# Patient Record
Sex: Female | Born: 1995 | Race: White | Hispanic: No | Marital: Single | Smoking: Never smoker
Health system: Southern US, Community
[De-identification: ages and names within clinical notes are randomized; demographics above are authoritative.]

## PROBLEM LIST (undated history)

## (undated) DIAGNOSIS — S060XAA Concussion with loss of consciousness status unknown, initial encounter: Secondary | ICD-10-CM

## (undated) DIAGNOSIS — S060X9A Concussion with loss of consciousness of unspecified duration, initial encounter: Secondary | ICD-10-CM

## (undated) HISTORY — DX: Concussion with loss of consciousness of unspecified duration, initial encounter: S06.0X9A

## (undated) HISTORY — DX: Concussion with loss of consciousness status unknown, initial encounter: S06.0XAA

---
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
## 2015-01-22 ENCOUNTER — Encounter: Payer: Self-pay | Admitting: Physician Assistant

## 2015-01-22 ENCOUNTER — Ambulatory Visit (INDEPENDENT_AMBULATORY_CARE_PROVIDER_SITE_OTHER): Payer: Managed Care, Other (non HMO) | Admitting: Physician Assistant

## 2015-01-22 VITALS — BP 129/80 | HR 78 | Resp 18 | Ht 62.0 in | Wt 162.0 lb

## 2015-01-22 DIAGNOSIS — G43009 Migraine without aura, not intractable, without status migrainosus: Secondary | ICD-10-CM

## 2015-01-22 MED ORDER — IBUPROFEN 600 MG PO TABS: 600.0000 mg | ORAL_TABLET | Freq: Four times a day (QID) | ORAL | Status: AC | PRN

## 2015-01-22 MED ORDER — TOPIRAMATE 25 MG PO TABS: 25.0000 mg | ORAL_TABLET | Freq: Two times a day (BID) | ORAL | Status: DC

## 2015-01-22 NOTE — Progress Notes (Signed)
Patient ID: Jocelyn Reese, female   DOB: 08-31-1995, 19 y.o.   MRN: [id number] History:  Jocelyn Reese is a 19 y.o. No obstetric history on file. who presents to clinic today for migraine follow up.  She started taking Topamax one month ago and has successfully titrated to 75mg  She notes significant improvement already.  The HA's are much less severe.  She noted a week of decreased appetite but this has been better. She has also had a couple hours here and there with tingling of fingers.   She used Imitrex 2 different days last week for Moderate HA's and when she awoke the next morning HA free.  No notable side effects.     HIT6:61 Number of days in the last 4 weeks with:  Severe headache: 0 Moderate headache: 2 Mild headache: 16  No headache: 10   Past Medical History  Diagnosis Date  . 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    Social History   Social History  . Marital Status: Single    Spouse Name: N/A  . Number of Children: N/A  . Years of Education: N/A   Occupational History  . Not on file.   Social History Main Topics  . Smoking status: Never Smoker   . Smokeless tobacco: Never Used  . Alcohol Use: No  . Drug Use: No  . Sexual Activity: Not Currently   Other Topics Concern  . Not on file   Social History Narrative    Family History  Problem Relation Age of Onset  . Crohn's disease Mother   . Colitis Mother   . Cancer Maternal Aunt 20    breast  . Cancer Maternal Aunt     cervical    No Known Allergies  Current Outpatient Prescriptions on File Prior to Visit  Medication Sig Dispense Refill  . ibuprofen (ADVIL,MOTRIN) 600 MG tablet Take 1 tablet (600 mg total) by mouth every 6 (six) hours as needed. 60 tablet 0  . norgestrel-ethinyl estradiol (LO/OVRAL,CRYSELLE) 0.3-30 MG-MCG tablet Take 1 tablet by mouth daily. 1 Package 11  . ondansetron (ZOFRAN ODT) 4 MG disintegrating tablet Take 1 tablet (4 mg total) by  mouth every 8 (eight) hours as needed for nausea or vomiting. 20 tablet 2  . PATADAY 0.2 % SOLN   4  . SUMAtriptan (IMITREX) 100 MG tablet Take 1 tablet (100 mg total) by mouth once as needed for migraine. May repeat in 2 hours if headache persists or recurs. 9 tablet 11  . topiramate (TOPAMAX) 25 MG tablet Take 1 tablet (25 mg total) by mouth 2 (two) times daily. 90 tablet 0  . nortriptyline (PAMELOR) 10 MG capsule   3   No current facility-administered medications on file prior to visit.     Review of Systems:  All pertinent positive/negative included in HPI, all other review of systems are negative  Objective:  Physical Exam BP 129/80 mmHg  Pulse 78  Resp 18  Ht 5\' 2"  (1.575 m)  Wt 162 lb (73.483 kg)  BMI 29.62 kg/m2  LMP 12/08/2014 CONSTITUTIONAL: Well-developed, well-nourished female in no acute distress.  EYES: EOM intact ENT: Normocephalic CARDIOVASCULAR: Regular rate and rhythm with no adventitious sounds.  RESPIRATORY: Normal rate. Clear to auscultation bilaterally.  ENDOCRINE: Normal thyroid.  MUSCULOSKELETAL: Normal ROM, strength equal bilaterallySKIN: Warm, dry without erythema  NEUROLOGICAL: Alert, oriented, CN II-XII grossly intact, Appropriate balance, No dysmetria, Sensation equal bilaterally, Romberg negative.   PSYCH:  Normal behavior, mood   Assessment & Plan:  Assessment: 1. Migraine without aura and without status migrainosus, not intractable      Plan: Continue Topamax at  daily Continue Imitrex for acute HA with  Ibuprofen.   Encouraged to take sooner and not wait for worsening.   May take vit B to help with tingling associated with Topamax. May use Nortriptyline prn sleep occas but preference given for benadryl.  Will not rx nortriptyline for this.   Pt starting college later this week at [employer].  Will return on winter break but may come back fall break prn.    Bertram Denver, PA-C 01/22/2015 9:15 AM

## 2015-01-22 NOTE — Patient Instructions (Signed)

## 2015-03-13 ENCOUNTER — Telehealth: Payer: Self-pay | Admitting: *Deleted

## 2015-03-13 DIAGNOSIS — G43809 Other migraine, not intractable, without status migrainosus: Secondary | ICD-10-CM

## 2015-03-13 MED ORDER — TOPIRAMATE 25 MG PO TABS
25.0000 mg | ORAL_TABLET | Freq: Three times a day (TID) | ORAL | Status: DC
Start: 2015-03-13 — End: 2015-03-16

## 2015-03-13 NOTE — Telephone Encounter (Signed)
I seen where Clydie Braun sent in refills on this medication at the time of visit for patient to take medication BID.  I sent in a new prescription with the new directions TID.

## 2015-03-13 NOTE — Telephone Encounter (Signed)
-----   Message from Kathee Delton, RN sent at 03/13/2015 10:36 AM EDT ----- Patient's pharmacy sent [place] a fax of patient requesting refill on her topiramate   Thanks, Lyla Son, [employer]

## 2015-03-16 ENCOUNTER — Telehealth: Payer: Self-pay | Admitting: Physician Assistant

## 2015-03-16 DIAGNOSIS — G43809 Other migraine, not intractable, without status migrainosus: Secondary | ICD-10-CM

## 2015-03-16 MED ORDER — TOPIRAMATE 25 MG PO TABS: 75.0000 mg | ORAL_TABLET | Freq: Every day | ORAL | Status: DC

## 2015-03-16 NOTE — Telephone Encounter (Signed)
Pt requesting Topamax refill for 90 day supply [facility] [address] in [place].   This is done.   [id number]  [facility]

## 2015-03-18 ENCOUNTER — Telehealth: Payer: Self-pay | Admitting: *Deleted

## 2015-03-18 DIAGNOSIS — G43809 Other migraine, not intractable, without status migrainosus: Secondary | ICD-10-CM

## 2015-03-18 MED ORDER — TOPIRAMATE 25 MG PO TABS: 25.0000 mg | ORAL_TABLET | Freq: Three times a day (TID) | ORAL | Status: DC

## 2015-03-18 NOTE — Telephone Encounter (Signed)
-----   Message from Pennie Banter sent at 03/15/2015  9:31 AM EDT ----- Needs her Topamax changed to a three month supply and needs to [facility] [place].

## 2015-03-18 NOTE — Telephone Encounter (Signed)
I have sent in a 90 day supply to patients pharmacy.

## 2015-04-17 ENCOUNTER — Other Ambulatory Visit: Payer: Self-pay | Admitting: Physician Assistant

## 2015-05-28 ENCOUNTER — Ambulatory Visit (INDEPENDENT_AMBULATORY_CARE_PROVIDER_SITE_OTHER): Payer: Managed Care, Other (non HMO) | Admitting: Physician Assistant

## 2015-05-28 ENCOUNTER — Encounter: Payer: Self-pay | Admitting: Physician Assistant

## 2015-05-28 VITALS — BP 125/75 | HR 71 | Resp 18 | Ht 62.0 in | Wt 152.0 lb

## 2015-05-28 DIAGNOSIS — G43809 Other migraine, not intractable, without status migrainosus: Secondary | ICD-10-CM | POA: Diagnosis not present

## 2015-05-28 DIAGNOSIS — G43009 Migraine without aura, not intractable, without status migrainosus: Secondary | ICD-10-CM

## 2015-05-28 DIAGNOSIS — R198 Other specified symptoms and signs involving the digestive system and abdomen: Secondary | ICD-10-CM | POA: Diagnosis not present

## 2015-05-28 DIAGNOSIS — G4700 Insomnia, unspecified: Secondary | ICD-10-CM | POA: Diagnosis not present

## 2015-05-28 MED ORDER — RIZATRIPTAN BENZOATE 10 MG PO TABS: 10.0000 mg | ORAL_TABLET | ORAL | Status: AC | PRN

## 2015-05-28 MED ORDER — BACLOFEN 10 MG PO TABS: 10.0000 mg | ORAL_TABLET | Freq: Three times a day (TID) | ORAL | Status: DC

## 2015-05-28 MED ORDER — PROMETHAZINE HCL 25 MG PO TABS: 25.0000 mg | ORAL_TABLET | Freq: Four times a day (QID) | ORAL | Status: DC | PRN

## 2015-05-28 MED ORDER — TOPIRAMATE 25 MG PO TABS: 75.0000 mg | ORAL_TABLET | Freq: Every day | ORAL | Status: DC

## 2015-05-28 NOTE — Progress Notes (Signed)
Patient ID: Jocelyn Reese, female   DOB: 09/16/1995, 19 y.o.   MRN: [id number] History:  Jocelyn NashMarissa C Sagraves is a 19 y.o. who presents to clinic today for follow up of headaches.  She had increased frequency when first starting school.  She also had a period of time that she had to decrease the Topamax due to tingling and numbness.  After her performance, she increased again and that was fine.   Imitrex helps the acute migraines but it is difficult to go to sleep with it.  Her HA's tend to be at night and she always has a difficult time sleeping.  She has noticed waking with clenched jaw.  This may be contributing to headache.  It occurs more with stress such as college finals week.    HIT6: 59 Number of days in the last 4 weeks with:  Severe headache: 2 Moderate headache: 5 Mild headache: 5  No headache: 16   Past Medical History  Diagnosis Date  . 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    Social History   Social History  . Marital Status: Single    Spouse Name: N/A  . Number of Children: N/A  . Years of Education: N/A   Occupational History  . Not on file.   Social History Main Topics  . Smoking status: Never Smoker   . Smokeless tobacco: Never Used  . Alcohol Use: No  . Drug Use: No  . Sexual Activity: Not Currently    Birth Control/ Protection: Pill   Other Topics Concern  . Not on file   Social History Narrative    Family History  Problem Relation Age of Onset  . Crohn's disease Mother   . Colitis Mother   . Cancer Maternal Aunt 3270    breast  . Cancer Maternal Aunt     cervical    No Known Allergies  Current Outpatient Prescriptions on File Prior to Visit  Medication Sig Dispense Refill  . ibuprofen (ADVIL,MOTRIN) 600 MG tablet Take 1 tablet (600 mg total) by mouth every 6 (six) hours as needed. 60 tablet 1  . norgestrel-ethinyl estradiol (LO/OVRAL,CRYSELLE) 0.3-30 MG-MCG tablet Take 1 tablet by mouth daily. 1  Package 11  . ondansetron (ZOFRAN ODT) 4 MG disintegrating tablet Take 1 tablet (4 mg total) by mouth every 8 (eight) hours as needed for nausea or vomiting. 20 tablet 2  . topiramate (TOPAMAX) 25 MG tablet Take 3 tablets (75 mg total) by mouth daily. 270 tablet 0  . SUMAtriptan (IMITREX) 100 MG tablet Take 1 tablet (100 mg total) by mouth once as needed for migraine. May repeat in 2 hours if headache persists or recurs. 9 tablet 11   No current facility-administered medications on file prior to visit.     Review of Systems:  All pertinent positive/negative included in HPI, all other review of systems are negative  Objective:  Physical Exam BP 125/75 mmHg  Pulse 71  Resp 18  Ht 5\' 2"  (1.575 m)  Wt 152 lb (68.947 kg)  BMI 27.79 kg/m2  LMP 05/23/2015 CONSTITUTIONAL: Well-developed, well-nourished female in no acute distress.  EYES: EOM intact, conjunctiva without erythema ENT: Normocephalic, atraumatic CARDIOVASCULAR: Regular rate and rhythm with no adventitious sounds.  RESPIRATORY: Normal rate. Clear to auscultation bilaterally.  MUSCULOSKELETAL: Normal ROM, strength equal bilaterally, bilat trapezius muscle spasm noted SKIN: Warm, dry without erythema  NEUROLOGICAL: Alert, oriented, CN II-XII grossly intact, Appropriate balance.   PSYCH: Normal  behavior, mood   Assessment & Plan:  Assessment: 1. Other type of migraine without status migrainosus   2. Migraine without aura and without status migrainosus, not intractable   3. Insomnia   4. Clenching of teeth     Plan: Baclofen prn muscle spasm/mild HA/jaw clenching - sedation precautions discussed at length Maxalt for acute HA Phenergan for nausea/HA rescue.   Topamax - continue at  for migraine prevention.  May trial Vitamin B for numbness/tingling prn.  Follow-up in 6 months or sooner PRN  Bertram Denver, PA-C 05/28/2015 10:28 AM

## 2015-05-28 NOTE — Patient Instructions (Signed)

## 2015-06-18 ENCOUNTER — Encounter: Payer: Managed Care, Other (non HMO) | Admitting: Physician Assistant

## 2015-11-19 ENCOUNTER — Other Ambulatory Visit: Payer: Self-pay | Admitting: Physician Assistant

## 2015-12-29 ENCOUNTER — Other Ambulatory Visit: Payer: Self-pay | Admitting: Obstetrics & Gynecology

## 2015-12-29 DIAGNOSIS — Z30018 Encounter for initial prescription of other contraceptives: Secondary | ICD-10-CM

## 2015-12-30 NOTE — Telephone Encounter (Signed)
Sent refill to pharmacy per Dr Marice Potter order.

## 2016-05-26 ENCOUNTER — Other Ambulatory Visit: Payer: Self-pay | Admitting: Physician Assistant

## 2016-05-26 DIAGNOSIS — G43009 Migraine without aura, not intractable, without status migrainosus: Secondary | ICD-10-CM

## 2016-05-29 NOTE — Telephone Encounter (Signed)
Sent one month supply to pharmacy, called pt to schedule appt, no answer, left message for pt to call the office and schedule appt for any further refills.

## 2016-05-29 NOTE — Telephone Encounter (Signed)
[facility]

## 2016-10-09 ENCOUNTER — Encounter: Payer: Managed Care, Other (non HMO) | Admitting: Physician Assistant

## 2016-12-02 ENCOUNTER — Other Ambulatory Visit: Payer: Self-pay | Admitting: Obstetrics & Gynecology

## 2016-12-02 DIAGNOSIS — Z30018 Encounter for initial prescription of other contraceptives: Secondary | ICD-10-CM

## 2017-01-06 ENCOUNTER — Ambulatory Visit: Payer: Managed Care, Other (non HMO) | Admitting: Obstetrics & Gynecology

## 2017-01-06 ENCOUNTER — Encounter: Payer: Self-pay | Admitting: Obstetrics & Gynecology

## 2017-02-09 ENCOUNTER — Encounter: Payer: Commercial Managed Care - PPO | Attending: Internal Medicine | Admitting: Dietician

## 2017-02-09 ENCOUNTER — Encounter: Payer: Self-pay | Admitting: Dietician

## 2017-02-09 VITALS — Ht 63.0 in | Wt 156.6 lb

## 2017-02-09 DIAGNOSIS — Z008 Encounter for other general examination: Secondary | ICD-10-CM | POA: Diagnosis not present

## 2017-02-09 NOTE — Progress Notes (Signed)
Medical Nutrition Therapy: Visit start time: 11:00am  end time: 12:00  Assessment:  Diagnosis: healthy diet habits Past medical history: migraines Psychosocial issues/ stress concerns: patient rates her stress as moderate but indicates "ok" as to how well she is dealing with her stress. Preferred learning method:  . Hands-on Current weight: 155.6 lbs Height: 63 in Medications, supplements: see list Progress and evaluation:  Patient in for medical nutrition therapy initial visit. 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 She reports that her weight has been in range of 145 lbs to 160 lbs in the past year. Her weight goal is 130-135 lbs.She was in [place] for the summer as part of an internship and she reports that many of her meals were "out". She states that she she tries to make healthy choices but portion control is more of an issue when dining out.  She is a vegetarian (past 6 years) and does include milk products. When school is in session, she snacks verses having meals and snacks usually consist of almonds or fruit or yogurt but will sometimes have a lean cuisine meal with a salad. She does include beans on a regular basis and has recently been eating a protein bar (12 gms protein) on some days to boost protein and states she has reached 56 gms on some days.  Her present diet is low in whole grains, fruits/vegetables and lower than recommended in protein. Her main beverage is water. She denies any history of binging or purging and reports a lowest weight of 125 lbs 2-3 years ago when she lost 25 -30 lbs with a very restrictive pattern of protein shakes as her main source of calories. She admits there is an underlying pressure regarding weight gain in dancing community. She reports no migraines recently. She will be leaving next week to study for a semester in  [place].   Nutrition Care Education: Basic nutrition:   Used Food guide plate, "Planning a balanced meal" and food models to instruct on food groups needed to meet basic nutrition needs. Reviewed sources of plant based protein and encouraged a minimum of 70 grams explaining that more would be needed when resumes hours of dance as part of her studies abroad. Use InBody Body composition analyzer to give print out of % body fat and muscle mass and discussed how strength training in addition to cardio in dance could help in increasing muscle mass. Nutritional Diagnosis:  NI-5.11.1 Predicted suboptimal nutrient intake As related to lower than recommended fruit, vegetable, whole grain and protein intake.  As evidenced by diet history.  Intervention:  Establish a consistent pattern of 3 meals spaced 4-6 hours apart with a serving of carbohydrate and 1 oz of protein as a snack if protein at snack is needed to meet daily protein needs. Balance meals with protein, 2-4 servings of carbohydrate and as many non-starchy vegetables as possible. Strive for minimum of 70 gms protein with range of 70-90 gms protein. When possible include low fat milk products to add to the protein in the basic protein foods (nuts, beans, peanut butter, cheese, tofu, textured vegetable protein products). Education Materials given:  . Food lists/ Planning A Balanced Meal . Plate Planner . Sample meal pattern/ menus . Vegetarian protein sources . Goals/ instructions  Learner/ who was taught:  . Patient  Level of understanding: Marland Kitchen Verbalized understanding Demonstrated degree of understanding via:  Teach back Learning barriers: . None Willingness to learn/ readiness for change: . Acceptance, ready for change  Monitoring and Evaluation: No follow-up scheduled as patient will be studying abroad for this semester. Encouraged her to call if desires follow-up next year.

## 2017-02-09 NOTE — Patient Instructions (Signed)
Establish a consistent pattern of 3 meals spaced 4-6 hours apart with a serving of carbohydrate and 1 oz of protein as a snack if protein at snack is needed to meet daily protein needs. Balance meals with protein, 2-4 servings of carbohydrate and as many non-starchy vegetables as possible. Strive for minimum of 70 gms protein with range of 70-90 gms protein. When possible include low fat milk products to add to the protein in the basic protein foods (nuts, beans, peanut butter, cheese, tofu, textured vegetable protein products).

## 2017-02-11 ENCOUNTER — Other Ambulatory Visit: Payer: Self-pay | Admitting: Physician Assistant

## 2017-02-12 ENCOUNTER — Encounter: Payer: Self-pay | Admitting: Obstetrics & Gynecology

## 2017-02-12 ENCOUNTER — Ambulatory Visit (INDEPENDENT_AMBULATORY_CARE_PROVIDER_SITE_OTHER): Payer: Commercial Managed Care - PPO | Admitting: Obstetrics & Gynecology

## 2017-02-12 VITALS — BP 117/74 | HR 74 | Wt 156.0 lb

## 2017-02-12 DIAGNOSIS — Z30430 Encounter for insertion of intrauterine contraceptive device: Secondary | ICD-10-CM | POA: Diagnosis not present

## 2017-02-12 DIAGNOSIS — Z23 Encounter for immunization: Secondary | ICD-10-CM | POA: Diagnosis not present

## 2017-02-12 DIAGNOSIS — Z3202 Encounter for pregnancy test, result negative: Secondary | ICD-10-CM | POA: Diagnosis not present

## 2017-02-12 LAB — POCT URINE PREGNANCY: Preg Test, Ur: NEGATIVE

## 2017-02-12 MED ORDER — LEVONORGESTREL 19.5 MG IU IUD
1.0000 [IU] | INTRAUTERINE_SYSTEM | Freq: Once | INTRAUTERINE | Status: DC
  Administered 2017-02-12: 1 [IU] via INTRAUTERINE

## 2017-02-12 MED ORDER — LEVONORGESTREL 19.5 MG IU IUD: 1.0000 [IU] | INTRAUTERINE_SYSTEM | Freq: Once | INTRAUTERINE | Status: AC

## 2017-02-12 NOTE — Progress Notes (Signed)
Patient tolerated well.

## 2017-02-12 NOTE — Progress Notes (Signed)
    GYNECOLOGY OFFICE PROCEDURE NOTE  Jocelyn Reese is a 21 y.o. G0 here for hormonal IUD insertion. After much discussion and research, she desires [name].  No GYN concerns.  Never had pap smear or HPV vaccine series. 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  Desires IUD as she forgets OCPs. No problems with sexual activity.  Also desires Tdap and flu vaccines.  IUD Insertion Procedure Note Patient identified, informed consent performed, consent signed.   Discussed risks of irregular bleeding, cramping, infection, malpositioning or misplacement of the IUD outside the uterus which may require further procedure such as laparoscopy. Time out was performed.  Urine pregnancy test negative.  Speculum placed in the vagina.  Cervix visualized.  Cleaned with Betadine x 3.  Grasped anteriorly with a single tooth tenaculum.  Uterus sounded to 8 cm.  Kyleena IUD placed per manufacturer's recommendations.  Strings trimmed to 3 cm. Tenaculum was removed, good hemostasis noted.  Patient tolerated procedure well.   Patient was given post-procedure instructions.  She was advised to have backup contraception for one week.  Patient was also asked to check IUD strings periodically and follow up when she returns for annual exam, pap smear (she will be 21) and IUD surveillance.  Advised to call or go to nearest provider for any IUD concerns.   Flu and Tdap vaccinations administered as per her request   Jaynie CollinsUGONNA  Jionni Helming, MD, FACOG Attending Obstetrician & Gynecologist, [facility] Practice Center for [facility], [facility]

## 2017-02-12 NOTE — Patient Instructions (Signed)

## 2018-01-18 ENCOUNTER — Encounter: Payer: Self-pay | Admitting: Obstetrics & Gynecology

## 2018-01-18 ENCOUNTER — Ambulatory Visit (INDEPENDENT_AMBULATORY_CARE_PROVIDER_SITE_OTHER): Payer: Commercial Managed Care - PPO | Admitting: Obstetrics & Gynecology

## 2018-01-18 VITALS — BP 132/86 | HR 70 | Ht 62.0 in | Wt 152.2 lb

## 2018-01-18 DIAGNOSIS — Z30431 Encounter for routine checking of intrauterine contraceptive device: Secondary | ICD-10-CM

## 2018-01-18 DIAGNOSIS — Z124 Encounter for screening for malignant neoplasm of cervix: Secondary | ICD-10-CM | POA: Diagnosis not present

## 2018-01-18 DIAGNOSIS — Z01419 Encounter for gynecological examination (general) (routine) without abnormal findings: Secondary | ICD-10-CM | POA: Diagnosis not present

## 2018-01-18 NOTE — Progress Notes (Signed)
Pt is 21yo G0 here for annual exam. Pt has never had pap, will do one today. Pt states she does not want any STD testing. Pt [place] [facility]. Pt states she does not get periods. Pt has no questions or concerns at this time.

## 2018-01-18 NOTE — Progress Notes (Signed)
GYNECOLOGY ANNUAL PREVENTATIVE CARE ENCOUNTER NOTE  Subjective:   [name] is a 22 y.o. G0 female here for a routine annual gynecologic exam and first pap smear.  Current complaints: none.  Had Kyleena placed last year, she does not get any periods with this and is happy. Very satisfied with method.   Denies abnormal vaginal bleeding, discharge, pelvic pain, problems with intercourse or other gynecologic concerns.    Gynecologic History No LMP recorded. (Menstrual status: IUD). Contraception: Kyleena IUD placed 02/12/2017. Did not receive HPV vaccine series, married now, not interested.  Obstetric History OB History  Gravida Para Term Preterm AB Living  0 0 0 0 0 0  SAB TAB Ectopic Multiple Live Births  0 0 0 0 0    Past Medical History:  Diagnosis Date  . 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    History reviewed. No pertinent surgical history.  Current Outpatient Medications on File Prior to Visit  Medication Sig Dispense Refill  . rizatriptan (MAXALT) 10 MG tablet Take 1 tablet (10 mg total) by mouth as needed for migraine. May repeat in 2 hours if needed 30 tablet 1  . SUMAtriptan (IMITREX) 100 MG tablet Take 100 mg by mouth every 2 (two) hours as needed for migraine. May repeat in 2 hours if headache persists or recurs.    Marland Kitchen. ibuprofen (ADVIL,MOTRIN) 600 MG tablet Take 1 tablet (600 mg total) by mouth every 6 (six) hours as needed. (Patient not taking: Reported on 01/18/2018) 60 tablet 1  . ibuprofen (ADVIL,MOTRIN) 600 MG tablet TAKE 1 TABLET (600 MG TOTAL) BY MOUTH EVERY 6 (SIX) HOURS AS NEEDED. (Patient not taking: Reported on 01/18/2018) 60 tablet 0   Current Facility-Administered Medications on File Prior to Visit  Medication Dose Route Frequency Provider Last Rate Last Dose  . Levonorgestrel IUD 1 Units  1 Units Intrauterine Once Karmyn Lowman A, MD        No Known Allergies  Social History:  reports that she has never  smoked. She has never used smokeless tobacco. She reports that she drinks alcohol. She reports that she does not use drugs.  She is graduating soon, just returned for study abroad.  Family History  Problem Relation Age of Onset  . Crohn's disease Mother   . Colitis Mother   . Cancer Maternal Aunt [id number]       breast  . Cancer Maternal Aunt        cervical    The following portions of the patient's history were reviewed and updated as appropriate: allergies, current medications, past family history, past medical history, past social history, past surgical history and problem list.  Review of Systems Pertinent items noted in HPI and remainder of comprehensive ROS otherwise negative.   Objective:  BP 132/86   Pulse 70   Ht 5\' 2"  (1.575 m)   Wt 152 lb 3.2 oz (69 kg)   BMI 27.84 kg/m  CONSTITUTIONAL: Well-developed, well-nourished female in no acute distress.  HENT:  Normocephalic, atraumatic, External right and left ear normal. Oropharynx is clear and moist EYES: Conjunctivae and EOM are normal. Pupils are equal, round, and reactive to light. No scleral icterus.  NECK: Normal range of motion, supple, no masses.  Normal thyroid.  SKIN: Skin is warm and dry. No rash noted. Not diaphoretic. No erythema. No pallor. MUSCULOSKELETAL: Normal range of motion. No tenderness.  No cyanosis, clubbing, or edema.  2+ distal pulses. NEUROLOGIC: Alert  and oriented to person, place, and time. Normal reflexes, muscle tone coordination. No cranial nerve deficit noted. PSYCHIATRIC: Normal mood and affect. Normal behavior. Normal judgment and thought content. CARDIOVASCULAR: Normal heart rate noted, regular rhythm RESPIRATORY: Clear to auscultation bilaterally. Effort and breath sounds normal, no problems with respiration noted. BREASTS: Symmetric in size. No masses, skin changes, nipple drainage, or lymphadenopathy. ABDOMEN: Soft, normal bowel sounds, no distention noted.  No tenderness, rebound or guarding.    PELVIC: Normal appearing external genitalia; normal appearing vaginal mucosa and cervix.  No abnormal discharge noted.  Kyleena IUD strings visualized about 1.5 cm in length. Pap smear obtained.  Normal uterine size, no other palpable masses, no uterine or adnexal tenderness.  Assessment and Plan:  1. Encounter for well woman exam - Cytology - PAP Will follow up results of pap smear and manage accordingly.  2. [facility] check up [facility] due for removal 02/12/2022 or earlier if indicated  Routine preventative health maintenance measures emphasized. Please refer to After Visit Summary for other counseling recommendations.    Jaynie CollinsUGONNA  Avigdor Dollar, MD, FACOG Obstetrician & Gynecologist, [facility], [facility]

## 2018-01-18 NOTE — Patient Instructions (Signed)
Preventive Care 18-39 Years, Female Preventive care refers to lifestyle choices and visits with your health care provider that can promote health and wellness. What does preventive care include?  A yearly physical exam. This is also called an annual well check.  Dental exams once or twice a year.  Routine eye exams. Ask your health care provider how often you should have your eyes checked.  Personal lifestyle choices, including: ? Daily care of your teeth and gums. ? Regular physical activity. ? Eating a healthy diet. ? Avoiding tobacco and drug use. ? Limiting alcohol use. ? Practicing safe sex. ? Taking vitamin and mineral supplements as recommended by your health care provider. What happens during an annual well check? The services and screenings done by your health care provider during your annual well check will depend on your age, overall health, lifestyle risk factors, and family history of disease. Counseling Your health care provider may ask you questions about your:  Alcohol use.  Tobacco use.  Drug use.  Emotional well-being.  Home and relationship well-being.  Sexual activity.  Eating habits.  Work and work Statistician.  Method of birth control.  Menstrual cycle.  Pregnancy history.  Screening You may have the following tests or measurements:  Height, weight, and BMI.  Diabetes screening. This is done by checking your blood sugar (glucose) after you have not eaten for a while (fasting).  Blood pressure.  Lipid and cholesterol levels. These may be checked every 5 years starting at age 66.  Skin check.  Hepatitis C blood test.  Hepatitis B blood test.  Sexually transmitted disease (STD) testing.  BRCA-related cancer screening. This may be done if you have a family history of breast, ovarian, tubal, or peritoneal cancers.  Pelvic exam and Pap test. This may be done every 3 years starting at age 40. Starting at age 59, this may be done every 5  years if you have a Pap test in combination with an HPV test.  Discuss your test results, treatment options, and if necessary, the need for more tests with your health care provider. Vaccines Your health care provider may recommend certain vaccines, such as:  Influenza vaccine. This is recommended every year.  Tetanus, diphtheria, and acellular pertussis (Tdap, Td) vaccine. You may need a Td booster every 10 years.  Varicella vaccine. You may need this if you have not been vaccinated.  HPV vaccine. If you are 69 or younger, you may need three doses over 6 months.  Measles, mumps, and rubella (MMR) vaccine. You may need at least one dose of MMR. You may also need a second dose.  Pneumococcal 13-valent conjugate (PCV13) vaccine. You may need this if you have certain conditions and were not previously vaccinated.  Pneumococcal polysaccharide (PPSV23) vaccine. You may need one or two doses if you smoke cigarettes or if you have certain conditions.  Meningococcal vaccine. One dose is recommended if you are age 27-21 years and a first-year college student living in a residence hall, or if you have one of several medical conditions. You may also need additional booster doses.  Hepatitis A vaccine. You may need this if you have certain conditions or if you travel or work in places where you may be exposed to hepatitis A.  Hepatitis B vaccine. You may need this if you have certain conditions or if you travel or work in places where you may be exposed to hepatitis B.  Haemophilus influenzae type b (Hib) vaccine. You may need this if  you have certain risk factors.  Talk to your health care provider about which screenings and vaccines you need and how often you need them. This information is not intended to replace advice given to you by your health care provider. Make sure you discuss any questions you have with your health care provider. Document Released: 07/21/2001 Document Revised: 02/12/2016  Document Reviewed: 03/26/2015 Elsevier Interactive Patient Education  [employer].

## 2018-01-20 LAB — CYTOLOGY - PAP: DIAGNOSIS: NEGATIVE

## 2018-06-02 ENCOUNTER — Encounter: Payer: Self-pay | Admitting: Obstetrics & Gynecology

## 2018-06-02 ENCOUNTER — Ambulatory Visit (INDEPENDENT_AMBULATORY_CARE_PROVIDER_SITE_OTHER): Payer: Commercial Managed Care - PPO | Admitting: Obstetrics & Gynecology

## 2018-06-02 VITALS — BP 107/66 | HR 76 | Ht 62.0 in | Wt 148.0 lb

## 2018-06-02 DIAGNOSIS — Z23 Encounter for immunization: Secondary | ICD-10-CM | POA: Diagnosis not present

## 2018-06-02 DIAGNOSIS — Z30431 Encounter for routine checking of intrauterine contraceptive device: Secondary | ICD-10-CM

## 2018-06-02 DIAGNOSIS — Z975 Presence of (intrauterine) contraceptive device: Secondary | ICD-10-CM | POA: Diagnosis not present

## 2018-06-02 DIAGNOSIS — N921 Excessive and frequent menstruation with irregular cycle: Secondary | ICD-10-CM

## 2018-06-02 NOTE — Progress Notes (Signed)
    GYNECOLOGY OFFICE VISIT NOTE  History:  22 y.o. G0P0000 here today for IUD check up. Jocelyn Reese placed 02/12/2017, she had no bleeding afterwards until 3 months ago. She then had sporadic bleeding four times in the past 3 months. Took UPT at home, was negative. No recent infections.  She denies any abnormal vaginal discharge, bleeding, pelvic pain or other concerns.   Past Medical History:  Diagnosis Date  . 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    No past surgical history on file.  The following portions of the patient's history were reviewed and updated as appropriate: allergies, current medications, past family history, past medical history, past social history, past surgical history and problem list.   Health Maintenance:  Normal pap and negative HRHPV on 01/18/2018.    Review of Systems:  Pertinent items noted in HPI and remainder of comprehensive ROS otherwise negative.  Objective:  Physical Exam BP 107/66   Pulse 76   Ht 5\' 2"  (1.575 m)   Wt 148 lb (67.1 kg)   LMP 05/16/2018   BMI 27.07 kg/m  CONSTITUTIONAL: Well-developed, well-nourished female in no acute distress.  HEENT:  Normocephalic, atraumatic. External right and left ear normal. No scleral icterus.  NECK: Normal range of motion, supple, no masses noted on observation SKIN: No rash noted. Not diaphoretic. No erythema. No pallor. MUSCULOSKELETAL: Normal range of motion. No edema noted. NEUROLOGIC: Alert and oriented to person, place, and time. Normal muscle tone coordination. No cranial nerve deficit noted. PSYCHIATRIC: Normal mood and affect. Normal behavior. Normal judgment and thought content. CARDIOVASCULAR: Normal heart rate noted RESPIRATORY: Effort and breath sounds normal, no problems with respiration noted ABDOMEN: No masses noted. No other overt distention noted.   PELVIC: Normal appearing external genitalia; normal appearing vaginal mucosa and cervix.  No abnormal  discharge noted. IUD strings seen.   Assessment & Plan:  1. IUD check up 2. Breakthrough bleeding with IUD Normal IUD strings. Reassured patient that periodic bleeding can occur [facility], but did tell her she can take a pack of OCPs ([place] sample given) to help with erratic bleeding. No other concerns.  Routine preventative health maintenance measures emphasized. Please refer to After Visit Summary for other counseling recommendations.   Return for any gynecologic concerns.   Total face-to-face time with patient: 15 minutes.  Over 50% of encounter was spent on counseling and coordination of care.   Jaynie CollinsUGONNA  Yanai Hobson, MD, FACOG Obstetrician & Gynecologist, [facility], [facility]

## 2018-06-02 NOTE — Patient Instructions (Signed)
Return to clinic for any scheduled appointments or for any gynecologic concerns as needed.   

## 2020-06-06 ENCOUNTER — Other Ambulatory Visit: Payer: Self-pay

## 2020-06-06 ENCOUNTER — Ambulatory Visit (INDEPENDENT_AMBULATORY_CARE_PROVIDER_SITE_OTHER): Payer: Commercial Managed Care - PPO | Admitting: Family Medicine

## 2020-06-06 ENCOUNTER — Encounter: Payer: Self-pay | Admitting: Family Medicine

## 2020-06-06 VITALS — BP 123/77 | HR 80 | Ht 62.0 in | Wt 152.0 lb

## 2020-06-06 DIAGNOSIS — Z01419 Encounter for gynecological examination (general) (routine) without abnormal findings: Secondary | ICD-10-CM | POA: Diagnosis not present

## 2020-06-06 LAB — POCT URINE PREGNANCY: Preg Test, Ur: NEGATIVE

## 2020-06-06 MED ORDER — ONDANSETRON 4 MG PO TBDP: 4.0000 mg | ORAL_TABLET | Freq: Four times a day (QID) | ORAL | 3 refills | Status: DC | PRN

## 2020-06-06 NOTE — Progress Notes (Signed)
No pap Kyleena placed in 2018, pt c/o of period like symptoms with no heavy bleeding. Pt reports cycles are irregular. Takes zofran for nausea   Covid Vaccines completed - Pfizer   Pt request UPT - Negative

## 2020-06-07 LAB — HEPATITIS C ANTIBODY: Hep C Virus Ab: <0.1 s/co ratio (ref 0.0–0.9)

## 2020-06-09 NOTE — Progress Notes (Signed)
   GYNECOLOGY ANNUAL PREVENTATIVE CARE ENCOUNTER NOTE  Subjective:   Jocelyn Reese is a 25 y.o. G0P0000 female here for a routine annual gynecologic exam.  Current complaints: period like sx with irregular bleeding.   Denies abnormal vaginal bleeding, discharge, pelvic pain, problems with intercourse or other gynecologic concerns.    Gynecologic History No LMP recorded. (Menstrual status: IUD). Contraception: IUD Last Pap: 2019. Results were: normal Last mammogram: NA  The following portions of the patient's history were reviewed and updated as appropriate: allergies, current medications, past family history, past medical history, past social history, past surgical history and problem list.  Review of Systems Pertinent items are noted in HPI.   Objective:  BP 123/77   Pulse 80   Ht 5\' 2"  (1.575 m)   Wt 152 lb (68.9 kg)   BMI 27.80 kg/m  CONSTITUTIONAL: Well-developed, well-nourished female in no acute distress.  HENT:  Normocephalic, atraumatic, External right and left ear normal. Oropharynx is clear and moist EYES:  No scleral icterus.  NECK: Normal range of motion, supple, no masses.  Normal thyroid.  SKIN: Skin is warm and dry. No rash noted. Not diaphoretic. No erythema. No pallor. NEUROLOGIC: Alert and oriented to person, place, and time. Normal reflexes, muscle tone coordination. No cranial nerve deficit noted. PSYCHIATRIC: Normal mood and affect. Normal behavior. Normal judgment and thought content. CARDIOVASCULAR: Normal heart rate noted, regular rhythm. 2+ distal pulses. RESPIRATORY: Effort and breath sounds normal, no problems with respiration noted. BREASTS: Symmetric in size. No masses, skin changes, nipple drainage, or lymphadenopathy. ABDOMEN: Soft,  no distention noted.  No tenderness, rebound or guarding.  PELVIC: Normal appearing external genitalia; normal appearing vaginal mucosa and cervix.  No abnormal discharge noted.  Pap smear -not indicated. IUD  strings seen.  Normal uterine size, no other palpable masses, no uterine or adnexal tenderness. MUSCULOSKELETAL: Normal range of motion.    Assessment and Plan:  1) Annual gynecologic examination:  Will follow up results of pap smear and manage accordingly. Declines STI screen.  Routine preventative health maintenance measures emphasized. Agrees to HCV screening.   2) Contraception counseling: Reviewed all forms of birth control options available including abstinence; over the counter/barrier methods; hormonal contraceptive medication including pill, patch, ring, injection,contraceptive implant; hormonal and nonhormonal IUDs; permanent sterilization options including vasectomy and the various tubal sterilization modalities. Risks and benefits reviewed.  Questions were answered.  Written information was also given to the patient to review.  Patient desires to keep IUD, this was prescribed for patient. She will follow up in  89yr for surveillance.  She was told to call with any further questions, or with any concerns about this method of contraception.  Emphasized use of condoms 100% of the time for STI prevention.  1. IUD - Discussed used of NSAIDS scheduled to help with sx.  - could also trial OCP x 3 month - Will keep device and try options above - if patient calls for OCP-- recommend sprintec x3 packs  Please refer to After Visit Summary for other counseling recommendations.   Return in about 1 year (around 06/06/2021) for Yearly wellness exam.  06/08/2021, MD, MPH, ABFM Attending Physician Center for [facility]

## 2020-06-12 ENCOUNTER — Ambulatory Visit: Payer: Commercial Managed Care - PPO | Admitting: Advanced Practice Midwife

## 2020-10-07 ENCOUNTER — Other Ambulatory Visit: Payer: Self-pay | Admitting: *Deleted

## 2020-10-07 MED ORDER — ONDANSETRON 4 MG PO TBDP: 4.0000 mg | ORAL_TABLET | Freq: Four times a day (QID) | ORAL | 0 refills | Status: AC | PRN

## 2021-04-17 ENCOUNTER — Encounter: Payer: Self-pay | Admitting: Radiology

## 2021-10-06 ENCOUNTER — Other Ambulatory Visit (HOSPITAL_COMMUNITY)
Admission: RE | Admit: 2021-10-06 | Discharge: 2021-10-06 | Disposition: A | Discharge status: Home / Self Care | Payer: Commercial Managed Care - PPO | Source: Ambulatory Visit | Attending: Family Medicine | Admitting: Family Medicine

## 2021-10-06 ENCOUNTER — Encounter: Payer: Self-pay | Admitting: Family Medicine

## 2021-10-06 ENCOUNTER — Ambulatory Visit (INDEPENDENT_AMBULATORY_CARE_PROVIDER_SITE_OTHER): Payer: Commercial Managed Care - PPO | Admitting: Family Medicine

## 2021-10-06 VITALS — BP 121/77 | HR 86 | Ht 62.0 in | Wt 161.0 lb

## 2021-10-06 DIAGNOSIS — Z01419 Encounter for gynecological examination (general) (routine) without abnormal findings: Secondary | ICD-10-CM

## 2021-10-06 DIAGNOSIS — R635 Abnormal weight gain: Secondary | ICD-10-CM

## 2021-10-06 DIAGNOSIS — Z131 Encounter for screening for diabetes mellitus: Secondary | ICD-10-CM | POA: Diagnosis not present

## 2021-10-06 DIAGNOSIS — Z975 Presence of (intrauterine) contraceptive device: Secondary | ICD-10-CM | POA: Diagnosis not present

## 2021-10-06 NOTE — Progress Notes (Signed)
Patient present for Annual Exam. ? ?LMP: 09/08/21 ?Last pap: ?Contraception: IUD  ?STD Screening: Declines  ?Family Hx of Breast Cancer:Maternal Aunt  ? ?CC: None  ? ? ? ?

## 2021-10-06 NOTE — Progress Notes (Signed)
? ?GYNECOLOGY ANNUAL PREVENTATIVE CARE ENCOUNTER NOTE ? ?Subjective:  ? Jocelyn Reese is a 26 y.o. G0P0000 female here for a routine annual gynecologic exam.  Current complaints: none.   Reports trouble with weight gain. She is in monogamus relationship. She declines STI screening. She is still not planning/desiring a pregnancy. Likes the IUD. Reports it was painful to place but otherwise is happy with method.  ? ?Denies abnormal vaginal bleeding, discharge, pelvic pain, problems with intercourse or other gynecologic concerns.  ?  ?Gynecologic History ?Patient's last menstrual period was 09/08/2021 (approximate). ?Contraception: IUD ?Last Pap: 2019. Results were: normal ?Last mammogram: NA.  ? ?Health Maintenance Due  ?Topic Date Due  ? HPV VACCINES (1 - 2-dose series) Never done  ? HIV Screening  Never done  ? PAP-Cervical Cytology Screening  01/18/2021  ? PAP SMEAR-Modifier  01/18/2021  ? ? ?The following portions of the patient's history were reviewed and updated as appropriate: allergies, current medications, past family history, past medical history, past social history, past surgical history and problem list. ? ?Review of Systems ?Pertinent items are noted in HPI. ?  ?Objective:  ?BP 121/77   Pulse 86   Ht 5\' 2"  (1.575 m)   Wt 161 lb (73 kg)   LMP 09/08/2021 (Approximate)   BMI 29.45 kg/m?  ?CONSTITUTIONAL: Well-developed, well-nourished female in no acute distress.  ?HENT:  Normocephalic, atraumatic, External right and left ear normal. Oropharynx is clear and moist ?EYES:  No scleral icterus.  ?NECK: Normal range of motion, supple, no masses.  Normal thyroid.  ?SKIN: Skin is warm and dry. No rash noted. Not diaphoretic. No erythema. No pallor. ?NEUROLOGIC: Alert and oriented to person, place, and time. Normal reflexes, muscle tone coordination. No cranial nerve deficit noted. ?PSYCHIATRIC: Normal mood and affect. Normal behavior. Normal judgment and thought content. ?CARDIOVASCULAR: Normal heart  rate noted, regular rhythm. 2+ distal pulses. ?RESPIRATORY: Effort and breath sounds normal, no problems with respiration noted. ?BREASTS: Symmetric in size. No masses, skin changes, nipple drainage, or lymphadenopathy. ?ABDOMEN: Soft,  no distention noted.  No tenderness, rebound or guarding.  ?PELVIC: Normal appearing external genitalia; normal appearing vaginal mucosa and cervix.  No abnormal discharge noted.  Pap smear obtained.  IUD strings seen.  ?Normal uterine size, no other palpable masses, no uterine or adnexal tenderness. ?MUSCULOSKELETAL: Normal range of motion.  ? ? ?Assessment and Plan:  ?1) Annual gynecologic examination with pap smear:  Will follow up results of pap smear and manage accordingly. Declined STI screen.  Routine preventative health maintenance measures emphasized. ? ?2) Contraception counseling: Reviewed all forms of birth control options available including abstinence; over the counter/barrier methods; hormonal contraceptive medication including pill, patch, ring, injection,contraceptive implant; hormonal and nonhormonal IUDs; permanent sterilization options including vasectomy and the various tubal sterilization modalities. Risks and benefits reviewed.  Questions were answered.  Written information was also given to the patient to review.  Patient desires IUD, this was prescribed for patient. She will follow up in  1 year for surveillance.  She was told to call with any further questions, or with any concerns about this method of contraception.  Emphasized use of condoms 100% of the time for STI prevention. ? ?1. Well woman exam with routine gynecological exam ?- Cytology - PAP ?- Reviewed preventative screenings by age/risk ?- TSH ?- Hemoglobin A1c ? ?2. IUD (intrauterine device) in place ?Strings easily seen ? ?3. Diabetes mellitus screening ?- Hemoglobin A1c ? ?4. Weight gain ?- TSH ? ? ? ?  Please refer to After Visit Summary for other counseling recommendations.  ? ?Return in about  1 year (around 10/07/2022) for Yearly wellness exam. ? ?Federico Flake, MD, MPH, ABFM ?Attending Physician ?[facility] ? ?

## 2021-10-07 LAB — TSH: TSH: 2.2 u[IU]/mL (ref 0.450–4.500)

## 2021-10-07 LAB — CYTOLOGY - PAP: Diagnosis: NEGATIVE

## 2021-10-07 LAB — HEMOGLOBIN A1C
Est. average glucose Bld gHb Est-mCnc: 105 mg/dL
Hgb A1c MFr Bld: 5.3 % (ref 4.8–5.6)
